# Patient Record
Sex: Male | Born: 2000 | Race: Black or African American | Hispanic: No | Marital: Single | Smoking: Never smoker
Health system: Southern US, Community
[De-identification: ages and names within clinical notes are randomized; demographics above are authoritative.]

## PROBLEM LIST (undated history)

## (undated) DIAGNOSIS — R569 Unspecified convulsions: Secondary | ICD-10-CM

---
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
## 2019-11-01 ENCOUNTER — Other Ambulatory Visit: Payer: Self-pay

## 2019-11-01 ENCOUNTER — Ambulatory Visit (INDEPENDENT_AMBULATORY_CARE_PROVIDER_SITE_OTHER): Payer: 59 | Admitting: Family Medicine

## 2019-11-01 ENCOUNTER — Encounter: Payer: Self-pay | Admitting: Family Medicine

## 2019-11-01 VITALS — BP 122/70 | HR 70 | Temp 97.9°F | Wt 154.2 lb

## 2019-11-01 DIAGNOSIS — J029 Acute pharyngitis, unspecified: Secondary | ICD-10-CM

## 2019-11-01 MED ORDER — DOXYCYCLINE HYCLATE 100 MG PO CAPS: 100.0000 mg | ORAL_CAPSULE | Freq: Two times a day (BID) | ORAL | 0 refills | Status: AC

## 2019-11-01 MED ORDER — CEPHALEXIN 500 MG PO CAPS: 500.0000 mg | ORAL_CAPSULE | Freq: Three times a day (TID) | ORAL | 0 refills | Status: AC

## 2019-11-01 NOTE — Progress Notes (Signed)
° °  Subjective:    Patient ID: Russell [name], male    DOB: [birth year], 19 y.o.   MRN: [id number]  HPI Here for the onset 3 days of a bad ST, headache, body aches, fever to 101 degrees, nausea without vomiting, abdominal pains, and a blistering rash over the face. No loss of taste or smell. No cough or chest pain or SOB. He is taking Tylenol and drinking fluids. No known tick bites. He had strep throats as a younger child. He has been immunized against measles and chickenpox. No other family members are sick.    Review of Systems  Constitutional: Positive for fever.  HENT: Positive for sore throat. Negative for ear pain and sinus pain.   Eyes: Negative.   Respiratory: Negative.   Cardiovascular: Negative.   Gastrointestinal: Positive for abdominal pain and nausea. Negative for abdominal distention, blood in stool, constipation, diarrhea and vomiting.  Genitourinary: Negative.   Musculoskeletal: Positive for myalgias.  Skin: Positive for rash.  Neurological: Positive for headaches.  Hematological: Positive for adenopathy.       Objective:   Physical Exam Constitutional:      General: He is not in acute distress. HENT:     Right Ear: Tympanic membrane, ear canal and external ear normal.     Left Ear: Tympanic membrane, ear canal and external ear normal.     Nose: Nose normal.     Mouth/Throat:     Comments: Posterior OP and tonsils are red without exudate  Eyes:     Conjunctiva/sclera: Conjunctivae normal.  Neck:     Comments: Mild tender AC lymphadenopathy on both sides  Cardiovascular:     Rate and Rhythm: Normal rate and regular rhythm.     Pulses: Normal pulses.     Heart sounds: Normal heart sounds.  Pulmonary:     Effort: Pulmonary effort is normal.     Breath sounds: Normal breath sounds.  Abdominal:     General: Abdomen is flat. Bowel sounds are normal. There is no distension.     Palpations: Abdomen is soft. There is no mass.     Tenderness: There is no abdominal  tenderness. There is no guarding or rebound.     Hernia: No hernia is present.     Comments: No HSM  Skin:    Comments: There are multiple red papulovesicular lesions across the face  Neurological:     Mental Status: He is alert.           Assessment & Plan:  This is likely either from a strep throat or from [facility] spotted fever. We will test for RMSF antibodies and will get a throat culture. Cover both possibilities with Doxycycline and Cephalexin. Recheck prn.  Gershon Crane, MD

## 2019-11-02 LAB — BASIC METABOLIC PANEL
BUN: 13 mg/dL (ref 7–20)
CO2: 28 mmol/L (ref 20–32)
Calcium: 10 mg/dL (ref 8.9–10.4)
Chloride: 104 mmol/L (ref 98–110)
Creat: 1.21 mg/dL (ref 0.60–1.26)
Glucose, Bld: 87 mg/dL (ref 65–99)
Potassium: 4.3 mmol/L (ref 3.8–5.1)
Sodium: 141 mmol/L (ref 135–146)

## 2019-11-02 LAB — HEPATIC FUNCTION PANEL
AG Ratio: 1.3 (calc) (ref 1.0–2.5)
ALT: 14 U/L (ref 8–46)
AST: 17 U/L (ref 12–32)
Albumin: 4.4 g/dL (ref 3.6–5.1)
Alkaline phosphatase (APISO): 77 U/L (ref 46–169)
Bilirubin, Direct: 0.1 mg/dL (ref 0.0–0.2)
Globulin: 3.4 g/dL (calc) (ref 2.1–3.5)
Indirect Bilirubin: 0.4 mg/dL (calc) (ref 0.2–1.1)
Total Bilirubin: 0.5 mg/dL (ref 0.2–1.1)
Total Protein: 7.8 g/dL (ref 6.3–8.2)

## 2019-11-02 LAB — CBC WITH DIFFERENTIAL/PLATELET
Absolute Monocytes: 499 cells/uL (ref 200–950)
Basophils Absolute: 38 cells/uL (ref 0–200)
Basophils Relative: 0.8 %
Eosinophils Absolute: 182 cells/uL (ref 15–500)
Eosinophils Relative: 3.8 %
HCT: 48.9 % (ref 38.5–50.0)
Hemoglobin: 16.5 g/dL (ref 13.2–17.1)
Lymphs Abs: 1704 cells/uL (ref 850–3900)
MCH: 31.1 pg (ref 27.0–33.0)
MCHC: 33.7 g/dL (ref 32.0–36.0)
MCV: 92.1 fL (ref 80.0–100.0)
MPV: 10.1 fL (ref 7.5–12.5)
Monocytes Relative: 10.4 %
Neutro Abs: 2376 cells/uL (ref 1500–7800)
Neutrophils Relative %: 49.5 %
Platelets: 297 10*3/uL (ref 140–400)
RBC: 5.31 10*6/uL (ref 4.20–5.80)
RDW: 12.5 % (ref 11.0–15.0)
Total Lymphocyte: 35.5 %
WBC: 4.8 10*3/uL (ref 3.8–10.8)

## 2019-11-02 LAB — ROCKY MTN SPOTTED FVR ABS PNL(IGG+IGM)
RMSF IgG: NOT DETECTED
RMSF IgM: NOT DETECTED

## 2019-11-03 LAB — CULTURE, GROUP A STREP
MICRO NUMBER:: 10865053
SPECIMEN QUALITY:: ADEQUATE

## 2020-02-08 ENCOUNTER — Encounter: Payer: Self-pay | Admitting: Family Medicine

## 2020-02-08 ENCOUNTER — Telehealth (INDEPENDENT_AMBULATORY_CARE_PROVIDER_SITE_OTHER): Payer: 59 | Admitting: Family Medicine

## 2020-02-08 VITALS — Ht 69.0 in | Wt 153.0 lb

## 2020-02-08 DIAGNOSIS — B349 Viral infection, unspecified: Secondary | ICD-10-CM | POA: Diagnosis not present

## 2020-02-08 NOTE — Progress Notes (Signed)
   Subjective:    Patient ID: Russell [name], male    DOB: 2001/03/03, 19 y.o.   MRN: [id number]  HPI Virtual Visit via Telephone Note  I connected with the patient on 02/08/20 at  3:30 PM EST by telephone and verified that I am speaking with the correct person using two identifiers.   I discussed the limitations, risks, security and privacy concerns of performing an evaluation and management service by telephone and the availability of in person appointments. I also discussed with the patient that there may be a patient responsible charge related to this service. The patient expressed understanding and agreed to proceed.  Location patient: home Location provider: work or home office Participants present for the call: patient, provider Patient did not have a visit in the prior 7 days to address this/these issue(s).   History of Present Illness: Here for 3 days of chills, headache, diarrhea, and loss of taste and smell. No vomiting. No ST or cough or SOB. Drinking fluids and taking Advil. He has not received any Covid vaccines.    Observations/Objective: Patient sounds cheerful and well on the phone. I do not appreciate any SOB. Speech and thought processing are grossly intact. Patient reported vitals:  Assessment and Plan: He has a viral illness, likely from [place]. I asked him to get tested for the Covid virus tonight and he agreed. In the meantime he will continue the treatments above and he should quarantine at home.  Gershon Crane, MD   Follow Up Instructions:     [phone] 5-10 [phone] 11-20 9443 21-30 I did not refer this patient for an OV in the next 24 hours for this/these issue(s).  I discussed the assessment and treatment plan with the patient. The patient was provided an opportunity to ask questions and all were answered. The patient agreed with the plan and demonstrated an understanding of the instructions.   The patient was advised to call back or seek an in-person  evaluation if the symptoms worsen or if the condition fails to improve as anticipated.  I provided 11 minutes of non-face-to-face time during this encounter.   Gershon Crane, MD    Review of Systems     Objective:   Physical Exam        Assessment & Plan:

## 2021-01-25 ENCOUNTER — Other Ambulatory Visit: Payer: Self-pay

## 2021-01-25 ENCOUNTER — Encounter (HOSPITAL_BASED_OUTPATIENT_CLINIC_OR_DEPARTMENT_OTHER): Payer: Self-pay | Admitting: *Deleted

## 2021-01-25 ENCOUNTER — Emergency Department (HOSPITAL_BASED_OUTPATIENT_CLINIC_OR_DEPARTMENT_OTHER)
Admission: EM | Admit: 2021-01-25 | Discharge: 2021-01-26 | Disposition: A | Discharge status: Home / Self Care | Payer: 59 | Attending: Emergency Medicine | Admitting: Emergency Medicine

## 2021-01-25 DIAGNOSIS — Z20822 Contact with and (suspected) exposure to covid-19: Secondary | ICD-10-CM | POA: Insufficient documentation

## 2021-01-25 DIAGNOSIS — J029 Acute pharyngitis, unspecified: Secondary | ICD-10-CM | POA: Diagnosis present

## 2021-01-25 DIAGNOSIS — Z91010 Allergy to peanuts: Secondary | ICD-10-CM | POA: Diagnosis not present

## 2021-01-25 DIAGNOSIS — J039 Acute tonsillitis, unspecified: Secondary | ICD-10-CM | POA: Diagnosis not present

## 2021-01-25 LAB — GROUP A STREP BY PCR: Group A Strep by PCR: NOT DETECTED

## 2021-01-25 LAB — RESP PANEL BY RT-PCR (FLU A&B, COVID) ARPGX2
Influenza A by PCR: NEGATIVE
Influenza B by PCR: NEGATIVE
SARS Coronavirus 2 by RT PCR: NEGATIVE

## 2021-01-25 MED ORDER — AMOXICILLIN 500 MG PO CAPS: 500.0000 mg | ORAL_CAPSULE | Freq: Three times a day (TID) | ORAL | 0 refills | Status: AC

## 2021-01-25 MED ORDER — CEPACOL REGULAR STRENGTH 3 MG MT LOZG: 1.0000 | LOZENGE | OROMUCOSAL | 12 refills | Status: AC | PRN

## 2021-01-25 MED ORDER — LIDOCAINE VISCOUS HCL 2 % MT SOLN
15.0000 mL | Freq: Once | OROMUCOSAL | Status: AC
  Administered 2021-01-26: 15 mL via OROMUCOSAL
  Filled 2021-01-25: qty 15

## 2021-01-25 MED ORDER — DEXAMETHASONE SODIUM PHOSPHATE 10 MG/ML IJ SOLN
10.0000 mg | Freq: Once | INTRAMUSCULAR | Status: AC
  Administered 2021-01-25: 10 mg via INTRAMUSCULAR
  Filled 2021-01-25: qty 1

## 2021-01-25 MED ORDER — AMOXICILLIN-POT CLAVULANATE 875-125 MG PO TABS: 1.0000 | ORAL_TABLET | Freq: Once | ORAL | Status: DC

## 2021-01-25 MED ORDER — AMOXICILLIN 500 MG PO CAPS
500.0000 mg | ORAL_CAPSULE | Freq: Once | ORAL | Status: AC
  Administered 2021-01-25: 500 mg via ORAL
  Filled 2021-01-25: qty 1

## 2021-01-25 NOTE — ED Triage Notes (Signed)
C/o sore throat and  right ear pain x 4 days

## 2021-01-25 NOTE — Discharge Instructions (Addendum)
You were seen in the emergency department today for sore throat.  While you were here we gave you a dose of steroids to reduce the swelling in the back your throat and a dose antibiotic.  I am sending you home with 10 days of antibiotics for you to take.  Please complete this course in its entirety.  I am also giving a prescription for Cepacol lozenges which help to soothe the throat.  Please return to emergency department if you have increased swelling in the back your throat or into your neck, fevers that do not respond to Tylenol or ibuprofen or difficulty swallowing.  Otherwise at like you to follow-up with your primary care provider at the end of your antibiotic course to reevaluate your tonsils

## 2021-01-25 NOTE — ED Provider Notes (Signed)
[facility] EMERGENCY DEPARTMENT Provider Note   CSN: [id number] Arrival date & time: 01/25/21  2239     History Chief Complaint  Patient presents with   Sore Throat    Russell [name] is a 20 y.o. male.  With past medical history of seizure who presents to the emergency department with sore throat.  He states Tuesday he began having sore throat and right ear pain.  He states that his as progressively worsened since Tuesday with noted fever of 103 yesterday.  He denies any previous ear surgeries or repeated infections.  Denies cough, chest pain or shortness of breath.  Denies difficulty swallowing, drooling.  He has been tolerating his secretions and p.o. diet at home.  No known sick contacts.   Sore Throat      Past Medical History:  Diagnosis Date   Seizures (HCC) last in 2011   used to see Dr. 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 No pertinent surgical history.     No family history on file.  Social History   Tobacco Use   Smoking status: Never   Smokeless tobacco: Never  Substance Use Topics   Alcohol use: No   Drug use: No    Home Medications Prior to Admission medications   Medication Sig Start Date End Date Taking? Authorizing Provider  albuterol (VENTOLIN HFA) 108 (90 Base) MCG/ACT inhaler Inhale into the lungs. 05/14/15   [provider]  fluocinonide (LIDEX) 0.05 % external solution Apply to the itchy spots on the scalp daily as needed 11/17/17   [provider]  SUMAtriptan (IMITREX) 100 MG tablet Take 1 tablet (100  mg total) by mouth as needed for migraine. May repeat in 2 hours if headache persists or recurs. 04/07/17   Nelwyn Salisbury, MD  traMADol (ULTRAM) 50 MG tablet Take 2 tablets (100 mg total) by mouth every 8 (eight) hours as needed for moderate pain. 04/07/17   Nelwyn Salisbury, MD    Allergies    Other and Peanut oil  Review of Systems   Review of Systems  Constitutional:  Positive for fever.  HENT:  Positive for ear pain and sore throat. Negative for drooling, rhinorrhea, sinus pain and trouble swallowing.   Respiratory:  Negative for cough and stridor.   Gastrointestinal:  Negative for nausea and vomiting.  All other systems reviewed and are negative.  Physical Exam Updated Vital Signs BP 135/82 (BP Location: Right Arm)   Pulse 94   Temp 98.1 F (36.7 C) (Oral)   Resp 18   Ht 5\' 10"  (1.778 m)   Wt 69.9 kg   SpO2 100%   BMI 22.10 kg/m   Physical Exam Vitals and nursing note reviewed.  Constitutional:      General: He is not in acute distress.    Appearance: He is well-developed. He is not toxic-appearing.  HENT:     Head: Normocephalic and atraumatic.     Right Ear: External ear normal. Tenderness present. No drainage or swelling. No mastoid tenderness. Tympanic membrane is erythematous.     Left Ear: External ear  normal. No drainage, swelling or tenderness. No mastoid tenderness. Tympanic membrane is erythematous.     Nose: Congestion present.     Mouth/Throat:     Lips: Pink.     Mouth: Mucous membranes are moist.     Pharynx: Uvula midline.     Tonsils: No tonsillar abscesses. 3+ on the right. 1+ on the left.  Eyes:     Conjunctiva/sclera: Conjunctivae normal.     Pupils: Pupils are equal, round, and reactive to light.  Cardiovascular:     Rate and Rhythm: Normal rate.     Heart sounds: Normal heart sounds. No murmur heard. Pulmonary:     Effort: Pulmonary effort is normal. No respiratory distress.     Breath sounds: No stridor.  Musculoskeletal:     Cervical back:  Normal range of motion and neck supple.  Lymphadenopathy:     Cervical: Cervical adenopathy present.  Skin:    General: Skin is warm and dry.     Capillary Refill: Capillary refill takes less than 2 seconds.     Findings: No rash.  Neurological:     General: No focal deficit present.     Mental Status: He is alert and oriented to person, place, and time.    ED Results / Procedures / Treatments   Labs (all labs ordered are listed, but only abnormal results are displayed) Labs Reviewed  RESP PANEL BY RT-PCR (FLU A&B, COVID) ARPGX2  GROUP A STREP BY PCR   EKG None  Radiology No results found.  Procedures Procedures   Medications Ordered in ED Medications  dexamethasone (DECADRON) injection 10 mg (10 mg Intramuscular Given 01/25/21 2359)  amoxicillin (AMOXIL) capsule 500 mg (500 mg Oral Given 01/25/21 2358)  lidocaine (XYLOCAINE) 2 % viscous mouth solution 15 mL (15 mLs Mouth/Throat Given 01/26/21 0004)   ED Course  I have reviewed the triage vital signs and the nursing notes.  Pertinent labs & imaging results that were available during my care of the patient were reviewed by me and considered in my medical decision making (see chart for details).    MDM Rules/Calculators/A&P 20 year old male who presents to the emergency department with sore throat.  No history of immunocompromise.  He is nontoxic in appearance.  There is no trismus on exam.  There is no airway compromise.  No change in voice.  He is able to tolerate p.o.  Given his history and exam I have low suspicion for PTA, RPA, Ludwick's angina, epiglottitis. Strep negative, flu and COVID-negative. He does have tonsillar swelling without PTA and pain with swallowing.  His bilateral TMs are also erythematous. Given 10 mg IM Decadron for tonsillar swelling, and viscous lidocaine for pain. Will treat for tonsillitis with amoxicillin, he is instructed return emergency department if he begins to have continued swelling or  difficulty swallowing with drooling, inability to tolerate his secretions or liquid diet, fever that is unresponsive to Tylenol or Motrin, change in voice, swelling under the jaw or new trismus.  He verbalizes understanding. Final Clinical Impression(s) / ED Diagnoses Final diagnoses:  Tonsillitis    Rx / DC Orders ED Discharge Orders          Ordered    amoxicillin (AMOXIL) 500 MG capsule  3 times daily        01/25/21 2357    menthol-cetylpyridinium (CEPACOL REGULAR STRENGTH) 3 MG lozenge  As needed        01/25/21 2357  Cristopher Peru, PA-C 01/26/21 1000    Charlynne Pander, MD 01/26/21 [phone]

## 2023-04-26 ENCOUNTER — Emergency Department (HOSPITAL_BASED_OUTPATIENT_CLINIC_OR_DEPARTMENT_OTHER): Payer: 59

## 2023-04-26 ENCOUNTER — Encounter (HOSPITAL_BASED_OUTPATIENT_CLINIC_OR_DEPARTMENT_OTHER): Payer: Self-pay | Admitting: Urology

## 2023-04-26 ENCOUNTER — Emergency Department (HOSPITAL_BASED_OUTPATIENT_CLINIC_OR_DEPARTMENT_OTHER)
Admission: EM | Admit: 2023-04-26 | Discharge: 2023-04-26 | Disposition: A | Discharge status: Home / Self Care | Payer: 59 | Attending: Emergency Medicine | Admitting: Emergency Medicine

## 2023-04-26 DIAGNOSIS — Y9367 Activity, basketball: Secondary | ICD-10-CM | POA: Insufficient documentation

## 2023-04-26 DIAGNOSIS — S93492A Sprain of other ligament of left ankle, initial encounter: Secondary | ICD-10-CM | POA: Diagnosis not present

## 2023-04-26 DIAGNOSIS — X501XXA Overexertion from prolonged static or awkward postures, initial encounter: Secondary | ICD-10-CM | POA: Diagnosis not present

## 2023-04-26 DIAGNOSIS — Z91010 Allergy to peanuts: Secondary | ICD-10-CM | POA: Diagnosis not present

## 2023-04-26 DIAGNOSIS — S99912A Unspecified injury of left ankle, initial encounter: Secondary | ICD-10-CM | POA: Diagnosis present

## 2023-04-26 NOTE — ED Provider Notes (Signed)
 [place] [facility] AT [facility] Provider Note   CSN: [id number] Arrival date & time: 04/26/23  1730     History  Chief Complaint  Patient presents with   Ankle Injury    Russell [name] is a 23 y.o. male.  Patient with no pertinent past medical history presents today with complaints of left ankle injury.  He states that same occurred prior to arrival today when he was playing basketball and twisted his ankle.  Endorses pain to same.  He is able to walk with some discomfort.  Denies any other injuries or complaints.  The history is provided by the patient. No language interpreter was used.  Ankle Injury       Home Medications Prior to Admission medications   Medication Sig Start Date End Date Taking? Authorizing Provider  albuterol (VENTOLIN HFA) 108 (90 Base) MCG/ACT inhaler Inhale into the lungs. 05/14/15   [provider]  fluocinonide (LIDEX) 0.05 % external solution Apply to the itchy spots on the scalp daily as needed 11/17/17   [provider]  menthol-cetylpyridinium (CEPACOL REGULAR STRENGTH) 3 MG lozenge Take 1 lozenge (3 mg total) by mouth as needed for sore throat. 01/25/21   Cristopher Peru, PA-C  SUMAtriptan (IMITREX) 100 MG tablet Take 1 tablet (100 mg total) by mouth as needed for migraine. May repeat in 2 hours if headache persists or recurs. 04/07/17   Nelwyn Salisbury, MD  traMADol (ULTRAM) 50 MG tablet Take 2 tablets (100 mg total) by mouth every 8 (eight) hours as needed for moderate pain. 04/07/17   Nelwyn Salisbury, MD      Allergies    Other and Peanut oil    Review of Systems   Review of Systems  Musculoskeletal:  Positive for arthralgias and myalgias.  All other systems reviewed and are negative.   Physical Exam Updated Vital Signs BP 127/89 (BP Location: Right Arm)   Pulse 89   Temp 98.7 F (37.1 C) (Oral)   Resp 18   Ht 5\' 10"  (1.778 m)   Wt 74.8 kg   SpO2 100%   BMI 23.68 kg/m  Physical Exam Vitals  and nursing note reviewed.  Constitutional:      General: He is not in acute distress.    Appearance: Normal appearance. He is normal weight. He is not ill-appearing, toxic-appearing or diaphoretic.  HENT:     Head: Normocephalic and atraumatic.  Cardiovascular:     Rate and Rhythm: Normal rate.  Pulmonary:     Effort: Pulmonary effort is normal. No respiratory distress.  Musculoskeletal:        General: Normal range of motion.     Cervical back: Normal range of motion.     Comments: TTP noted to the lateral aspect of the left ankle.  No tenderness to the midfoot or lower leg area.  Patient able to walk with a limp.  DP and PT pulses intact and 2+.  No obvious deformity.  Skin:    General: Skin is warm and dry.  Neurological:     General: No focal deficit present.     Mental Status: He is alert.  Psychiatric:        Mood and Affect: Mood normal.        Behavior: Behavior normal.     ED Results / Procedures / Treatments   Labs (all labs ordered are listed, but only abnormal results are displayed) Labs Reviewed - No data to display  EKG None  Radiology DG Ankle Complete Left Result Date: 04/26/2023 CLINICAL DATA:  ankle injury today, +swelling EXAM: LEFT ANKLE COMPLETE - 3+ VIEW COMPARISON:  None Available. FINDINGS: No acute fracture or dislocation. Joint spaces and alignment are maintained. No area of erosion or osseous destruction. No unexpected radiopaque foreign body. Soft tissue edema. IMPRESSION: 1. No acute fracture or dislocation. 2. Soft tissue edema. Electronically Signed   By: Meda Klinefelter M.D.   On: 04/26/2023 18:02    Procedures Procedures    Medications Ordered in ED Medications - No data to display  ED Course/ Medical Decision Making/ A&P                                 Medical Decision Making Amount and/or Complexity of Data Reviewed Radiology: ordered.   Patient presents today with complaints of left ankle injury immediate prior to arrival  today.  He is afebrile, nontoxic-appearing, and in no acute distress with reassuring vital signs.  Physical exam reveals swelling and bruising and tenderness over the ATFL of the left ankle.  No tenderness to the midfoot or lower leg.  Patient able to walk with some discomfort.  Good distal pulses and sensation.  No deformity.  X-ray imaging obtained which has resulted and reveals no acute fracture or dislocation.  Soft tissue edema.  I personally reviewed and interpreted this imaging and agree with radiology interpretation.  Discussed findings with patient is understanding and in agreement with this.  I did offer bracing for support and orthopedic referral for follow-up, however patient states that he sprained his ankle last year and already has a boot, crutches, and an orthopedist that he follows with.  Recommend he wear his boot for support and call his orthopedist to schedule close follow-up appointment.  RICE and Tylenol/ibuprofen as needed for pain recommended and discussed. Evaluation and diagnostic testing in the emergency department does not suggest an emergent condition requiring admission or immediate intervention beyond what has been performed at this time.  Plan for discharge with close PCP follow-up.  Patient is understanding and amenable with plan, educated on red flag symptoms that would prompt immediate return.  Patient discharged in stable condition.  Final Clinical Impression(s) / ED Diagnoses Final diagnoses:  Sprain of anterior talofibular ligament of left ankle, initial encounter    Rx / DC Orders ED Discharge Orders     None     An After Visit Summary was printed and given to the patient.     Vear Clock 04/26/23 2102    Glyn Ade, MD 04/26/23 2330

## 2023-04-26 NOTE — ED Triage Notes (Signed)
 Pt states twisted left ankle while playing basketball approx 40 min PTA  Swelling noted, pain with weight bearing  Denies previous injury

## 2023-04-26 NOTE — ED Notes (Signed)
 Patient transported to X-ray

## 2023-04-26 NOTE — Discharge Instructions (Signed)
 As we discussed, your workup in the ER today was reassuring for acute findings.  X-ray imaging of your ankle did not reveal any fracture or dislocation.  However you have likely sustained an ankle sprain.  I recommend that you wear your boot for support and call your orthopedist to schedule a follow-up appointment.  In the interim, I recommend that you rest, ice, compress, and elevate your foot and take Tylenol/ibuprofen as needed for pain.  Return if development of any new or worsening symptoms.
